# Patient Record
Sex: Male | Born: 2000 | Race: White | Hispanic: No | Marital: Single | Smoking: Never smoker
Health system: Southern US, Community
[De-identification: ages and names within clinical notes are randomized; demographics above are authoritative.]

## PROBLEM LIST (undated history)

## (undated) DIAGNOSIS — Z87820 Personal history of traumatic brain injury: Secondary | ICD-10-CM

---
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
## 2023-08-30 ENCOUNTER — Ambulatory Visit: Payer: Self-pay

## 2023-08-30 ENCOUNTER — Ambulatory Visit
Admission: EM | Admit: 2023-08-30 | Discharge: 2023-08-30 | Disposition: A | Discharge status: Home / Self Care | Attending: Family Medicine | Admitting: Family Medicine

## 2023-08-30 DIAGNOSIS — J029 Acute pharyngitis, unspecified: Secondary | ICD-10-CM | POA: Diagnosis not present

## 2023-08-30 LAB — POCT RAPID STREP A (OFFICE): Rapid Strep A Screen: NEGATIVE

## 2023-08-30 MED ORDER — AMOXICILLIN 400 MG/5ML PO SUSR: ORAL | 0 refills | Status: AC

## 2023-08-30 NOTE — ED Provider Notes (Signed)
 [facility]    CSN: [id number] Arrival date & time: 08/30/23  1409      History   Chief Complaint Chief Complaint  Patient presents with   Sore Throat    HPI Curtis Garcia is a 23 y.o. male.   Twelve days ago patient developed sore throat, fatigue, fever, and headache. He has also had a mild cough and nasal congestion, but the sore throat has persisted.  He has had a negative home covid test.  The history is provided by the patient and a parent.    Past Medical History:  Diagnosis Date   History of concussion     Patient Active Problem List   Diagnosis Date Noted   Murmur 03/31/2014    History reviewed. No pertinent surgical history.     Home Medications    Prior to Admission medications   Medication Sig Start Date End Date Taking? Authorizing Provider  amoxicillin (AMOXIL) 400 MG/5ML suspension Take 6.25mL PO BID for 10 days. 08/30/23  Yes Leon Rajas, MD    Family History Family History  Problem Relation Age of Onset   Healthy Mother    Hyperlipidemia Father    Hypertension Father     Social History Social History   Tobacco Use   Smoking status: Never   Smokeless tobacco: Never  Vaping Use   Vaping status: Never Used     Allergies   Patient has no known allergies.   Review of Systems Review of Systems + sore throat + cough No pleuritic pain No wheezing + mild nasal congestion ? post-nasal drainage No sinus pain/pressure No itchy/red eyes No earache No hemoptysis No SOB + fever No nausea No vomiting No abdominal pain No diarrhea No urinary symptoms No skin rash + fatigue No myalgias + headache Used OTC meds (Tylenol) without relief   Physical Exam Triage Vital Signs ED Triage Vitals  Encounter Vitals Group     BP 08/30/23 1511 115/72     Systolic BP Percentile --      Diastolic BP Percentile --      Pulse Rate 08/30/23 1511 98     Resp 08/30/23 1511 20     Temp 08/30/23 1511 99.2 F (37.3 C)      Temp src --      SpO2 08/30/23 1511 98 %     Weight --      Height --      Head Circumference --      Peak Flow --      Pain Score 08/30/23 1510 6     Pain Loc --      Pain Education --      Exclude from Growth Chart --    No data found.  Updated Vital Signs BP 115/72   Pulse 98   Temp 99.2 F (37.3 C)   Resp 20   SpO2 98%   Visual Acuity Right Eye Distance:   Left Eye Distance:   Bilateral Distance:    Right Eye Near:   Left Eye Near:    Bilateral Near:     Physical Exam Nursing notes and Vital Signs reviewed. Appearance:  Patient appears stated age, and in no acute distress Eyes:  Pupils are equal, round, and reactive to light and accomodation.  Extraocular movement is intact.  Conjunctivae are not inflamed  Ears:  Canals normal.  Tympanic membranes normal.  Nose:  Normal turbinates.  No sinus tenderness. Pharynx: Erythematous and mildly edematous with small amounts of  exudate present.  No obstruction. Neck:  Supple. Tonsillar nodes tender to palpation.   Lungs:  Clear to auscultation.  Breath sounds are equal.  Moving air well. Heart:  Regular rate and rhythm without murmurs, rubs, or gallops.  Abdomen:  Nontender without masses or hepatosplenomegaly.  Bowel sounds are present.  No CVA or flank tenderness.  Extremities:  No edema.  Skin:  No rash present.   UC Treatments / Results  Labs (all labs ordered are listed, but only abnormal results are displayed) Labs Reviewed  POCT RAPID STREP A (OFFICE) - Normal    EKG   Radiology No results found.  Procedures Procedures (including critical care time)  Medications Ordered in UC Medications - No data to display  Initial Impression / Assessment and Plan / UC Course  I have reviewed the triage vital signs and the nursing notes.  Pertinent labs & imaging results that were available during my care of the patient were reviewed by me and considered in my medical decision making (see chart for details).     Suspect strep pharyngitis with false negative rapid strep test.  Begin amoxicillin 500mg  BID for 10 days (patient prefers suspension). Followup with Family Doctor if not improved in one week.   Final Clinical Impressions(s) / UC Diagnoses   Final diagnoses:  Pharyngitis, unspecified etiology     Discharge Instructions      Try warm salt water gargles for sore throat.  May take ibuprofen as needed.    ED Prescriptions     Medication Sig Dispense Auth. Provider   amoxicillin (AMOXIL) 400 MG/5ML suspension Take 6.25mL PO BID for 10 days. 125 mL Leon Rajas, MD         Leon Rajas, MD 09/01/23 [phone]

## 2023-08-30 NOTE — ED Triage Notes (Addendum)
 Pt presents to [facility] with mother. Pt reports sore throat for 2 weeks. Mild cough, congestion, fevers, ha. Pt reports he has taken otc tylenol and Advil. Covid neg at home.

## 2023-08-30 NOTE — Discharge Instructions (Signed)
Try warm salt water gargles for sore throat.  May take ibuprofen as needed. °

## 2023-08-30 NOTE — Telephone Encounter (Signed)
  Chief Complaint: Sore throat Symptoms: white spots on back of throat, body aches, low grade fever Frequency: since 08/25/2023 Pertinent Negatives: Patient denies CP, SOB Disposition: [] ED /[x] Urgent Care (no appt availability in office) / [] Appointment(In office/virtual)/ []  [facility]/ [] Home Care/ [] Refused Recommended Disposition /[] [facility]/ []  Follow-up with PCP Additional Notes: patient's mother called for patient. Per mother, patient has been having severe sore throat, white spots in the back of throat, body aches, and low grade fevers since 08/25/2023. Throat pain is severe at this time. Per protocol, patient is recommended to be seen within 24 hours. Patient isn't established with a practice and is recommended to Urgent Care. Mother refused to established care as the patient is getting ready to graduate college and will be moving. Mother verbalized understanding and all questions answered.   Copied from CRM [id number]. Topic: Clinical - Red Word Triage >> Aug 30, 2023  9:39 AM Everette Rank wrote: Red Word that prompted transfer to Nurse Triage: New Patient last seen in 2020-White spots in back of throat Symptoms: bodyaches/Sore Throat/blowing nose/Fever on and off since 04/06/Fever but is gone now Reason for Disposition  SEVERE (e.g., excruciating) throat pain  Answer Assessment - Initial Assessment Questions 1. ONSET: "When did the throat start hurting?" (Hours or days ago)      Started on Sunday 2. SEVERITY: "How bad is the sore throat?" (Scale 1-10; mild, moderate or severe)   - MILD (1-3):  Doesn't interfere with eating or normal activities.   - MODERATE (4-7): Interferes with eating some solids and normal activities.   - SEVERE (8-10):  Excruciating pain, interferes with most normal activities.   - SEVERE WITH DYSPHAGIA (10): Can't swallow liquids, drooling.     Severe 3. STREP EXPOSURE: "Has there been any exposure to strep within the past week?" If Yes, ask:  "What type of contact occurred?"      Possibly-college student 4.  VIRAL SYMPTOMS: "Are there any symptoms of a cold, such as a runny nose, cough, hoarse voice or red eyes?"      Cold symptoms 5. FEVER: "Do you have a fever?" If Yes, ask: "What is your temperature, how was it measured, and when did it start?"    Low grade fever was on and off since 08/25/2023 but no fever currently 6. PUS ON THE TONSILS: "Is there pus on the tonsils in the back of your throat?"     White spots on the back of throat-possible pus on tonsils 7. OTHER SYMPTOMS: "Do you have any other symptoms?" (e.g., difficulty breathing, headache, rash)     Body aches  Protocols used: Sore Throat-A-AH

## 2023-09-04 ENCOUNTER — Other Ambulatory Visit: Payer: Self-pay

## 2023-09-04 ENCOUNTER — Ambulatory Visit
Admission: RE | Admit: 2023-09-04 | Discharge: 2023-09-04 | Disposition: A | Discharge status: Home / Self Care | Source: Ambulatory Visit

## 2023-09-04 VITALS — BP 116/72 | HR 82 | Temp 98.5°F | Resp 16

## 2023-09-04 DIAGNOSIS — B2700 Gammaherpesviral mononucleosis without complication: Secondary | ICD-10-CM

## 2023-09-04 LAB — POCT MONO SCREEN (KUC): Mono, POC: POSITIVE — AB

## 2023-09-04 NOTE — ED Provider Notes (Signed)
 [facility]    CSN: [id number] Arrival date & time: 09/04/23  1520      History   Chief Complaint Chief Complaint  Patient presents with   Sore Throat    HPI Curtis Garcia is a 23 y.o. male.   Patient is a 23 year old male who presents to the urgent care today with concerns of sore throat, fatigue, swollen painful lymph nodes, and occasional cough.  He reports that his symptoms have been going on for about 2 weeks now.  He was seen in the urgent care on 08/30/2023 and was tested for strep.  He tested negative for strep but was placed on amoxicillin regardless.  He has been taking this antibiotic with no improvement in his symptoms.  He has also tried ibuprofen, salt water gargles, and other over-the-counter options which have helped somewhat with his symptoms.  He denies any fever, nausea, vomiting, abdominal pain, diarrhea, rash, drooling, difficulty breathing, chest pain, or other concerns at this time.  He does not play contact sports and denies any recent sick contacts.    Past Medical History:  Diagnosis Date   History of concussion     Patient Active Problem List   Diagnosis Date Noted   Murmur 03/31/2014    History reviewed. No pertinent surgical history.     Home Medications    Prior to Admission medications   Medication Sig Start Date End Date Taking? Authorizing Provider  amoxicillin (AMOXIL) 400 MG/5ML suspension Take 6.52mL PO BID for 10 days. 08/30/23   Lattie Haw, MD    Family History Family History  Problem Relation Age of Onset   Healthy Mother    Hyperlipidemia Father    Hypertension Father     Social History Social History   Tobacco Use   Smoking status: Never   Smokeless tobacco: Never  Vaping Use   Vaping status: Never Used  Substance Use Topics   Alcohol use: Never   Drug use: Never     Allergies   Patient has no known allergies.   Review of Systems Review of Systems See HPI for open ROS.  Physical  Exam Triage Vital Signs ED Triage Vitals  Encounter Vitals Group     BP 09/04/23 1536 116/72     Systolic BP Percentile --      Diastolic BP Percentile --      Pulse Rate 09/04/23 1536 82     Resp 09/04/23 1536 16     Temp 09/04/23 1536 98.5 F (36.9 C)     Temp src --      SpO2 09/04/23 1536 98 %     Weight --      Height --      Head Circumference --      Peak Flow --      Pain Score 09/04/23 1539 7     Pain Loc --      Pain Education --      Exclude from Growth Chart --    No data found.  Updated Vital Signs BP 116/72   Pulse 82   Temp 98.5 F (36.9 C)   Resp 16   SpO2 98%   Visual Acuity Right Eye Distance:   Left Eye Distance:   Bilateral Distance:    Right Eye Near:   Left Eye Near:    Bilateral Near:     Physical Exam General: Alert and oriented, well-developed/well-nourished, calm, cooperative, no acute distress HEENT: Normocephalic atraumatic, moist mucous membranes, no scleral  icterus, trachea midline, uvula midline, pharynx without erythema with tonsillar swelling with exudates Lungs: Speaking full sentences, non-labored respirations, no distress Heart: Regular rate and rhythm Abdomen:  Soft, nondistended, no splenomegaly felt, nontender Musculoskeletal: Moves all extremities well Neurologic: Awake, A&O x4, gait normal Integumentary: Warm, dry, normal for ethnicity, intact, no rash Psychiatric: Appropriate mood & affect  UC Treatments / Results  Labs (all labs ordered are listed, but only abnormal results are displayed) Labs Reviewed  POCT MONO SCREEN ([facility]) - Abnormal; Notable for the following components:      Result Value   Mono, POC Positive (*)    All other components within normal limits    EKG   Radiology No results found.  Procedures Procedures (including critical care time)  Medications Ordered in UC Medications - No data to display  Initial Impression / Assessment and Plan / UC Course  I have reviewed the triage vital  signs and the nursing notes.  Pertinent labs & imaging results that were available during my care of the patient were reviewed by me and considered in my medical decision making (see chart for details).  Presents with sore throat, swollen lymph nodes, fatigue, and occasional cough.  Differential diagnosis includes: Bacterial pharyngitis, viral pharyngitis, influenza, covid, URI, PTA, RPA, epiglottitis, bacterial tracheitis, EBV, Ludwigs angina, acute HIV, including other diagnoses.  Previous recent discharge summaries and outpatient notes were reviewed by me. These were notable for: On 08/30/2023, patient seen in the urgent care with sore throat, fatigue, fever, and headache.  Rapid strep was obtained and was negative.  Patient started on amoxicillin and discharged home in stable condition. Marland Kitchen  History obtained from: Patient.  Plan at this time/rationale: Monospot test.  All ordered tests including imaging and labs were independently reviewed and interpreted by myself. Notable findings: Monospot test was positive.  Plan: Patient presented with sore throat.  Monospot test was obtained and was positive.  The patient did not have trismus, hot potato voice, uvula deviation, unilateral tonsillar swelling, toxic appearance, drooling, or pain with movement of the trachea. Additionally, patient able to tolerate PO medicines outpatient. Patient can take ibuprofen and Tylenol as needed for pain relief.  Discussed other supportive therapies patient to do for treatment of mono. Patient should follow up with their PCP if symptoms or not improving. Strict return precautions to the urgent care or emergency department were discussed including if they experience any difficulty breathing, inability to swallow, drooling, have neck pain with movement, symptoms worsen, or if there are any other concerns.   Disposition: Stable to discharge home.    All questions answered to the best of this examiner's ability. Advised  to f/u with PCP for further eval and/or reassessment. Patient agrees to plan.  An appropriate evaluation has been performed, and in my medical judgment there is currently no evidence of an immediate life-threatening or surgical condition. Discharge is therefore indicated at this time.  This document was created using the aid of voice recognition Scientist, clinical (histocompatibility and immunogenetics).   Final Clinical Impressions(s) / UC Diagnoses   Final diagnoses:  Gammaherpesviral mononucleosis without complication     Discharge Instructions      We recommend discontinuing the antibiotic.  You can take Tylenol and ibuprofen as needed for pain relief.  Please go to the emergency department if you are unable to swallow, begin drooling, difficulty breathing, or have any other concerns.     ED Prescriptions   None    PDMP not reviewed this encounter.  Edna Gouty, PA-C 09/04/23 1655

## 2023-09-04 NOTE — Discharge Instructions (Addendum)
 We recommend discontinuing the antibiotic.  You can take Tylenol and ibuprofen as needed for pain relief.  Please go to the emergency department if you are unable to swallow, begin drooling, difficulty breathing, or have any other concerns.

## 2023-09-04 NOTE — ED Triage Notes (Signed)
 Seen on 4/11. Still has sore throat, feels tired, loss of appetite, lymph nodes swollen. Had ibuprofen around 1300.
